# Patient Record
Sex: Male | Born: 2000 | Race: White | Hispanic: No | Marital: Single | Smoking: Never smoker
Health system: Southern US, Community
[De-identification: ages and names within clinical notes are randomized; demographics above are authoritative.]

## PROBLEM LIST (undated history)

## (undated) DIAGNOSIS — M545 Low back pain, unspecified: Secondary | ICD-10-CM

## (undated) DIAGNOSIS — R55 Syncope and collapse: Secondary | ICD-10-CM

## (undated) DIAGNOSIS — R51 Headache: Secondary | ICD-10-CM

## (undated) HISTORY — PX: CIRCUMCISION: SHX1350

## (undated) HISTORY — DX: Headache: R51

## (undated) HISTORY — DX: Low back pain, unspecified: M54.50

## (undated) HISTORY — DX: Syncope and collapse: R55

## (undated) HISTORY — DX: Low back pain: M54.5

---
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
## 2020-10-16 ENCOUNTER — Encounter: Payer: Self-pay | Admitting: Pediatrics

## 2021-08-09 ENCOUNTER — Encounter: Payer: Self-pay | Admitting: *Deleted

## 2023-12-26 ENCOUNTER — Encounter: Payer: Self-pay | Admitting: *Deleted
